# Patient Record
Sex: Female | Born: 1965
Health system: Southern US, Community
[De-identification: ages and names within clinical notes are randomized; demographics above are authoritative.]

## PROBLEM LIST (undated history)

## (undated) DIAGNOSIS — O24419 Gestational diabetes mellitus in pregnancy, unspecified control: Secondary | ICD-10-CM

## (undated) DIAGNOSIS — Z8619 Personal history of other infectious and parasitic diseases: Secondary | ICD-10-CM

## (undated) DIAGNOSIS — M8580 Other specified disorders of bone density and structure, unspecified site: Secondary | ICD-10-CM

## (undated) HISTORY — DX: Gestational diabetes mellitus in pregnancy, unspecified control: O24.419

## (undated) HISTORY — DX: Other specified disorders of bone density and structure, unspecified site: M85.80

## (undated) HISTORY — DX: Personal history of other infectious and parasitic diseases: Z86.19

---

## 1998-12-30 ENCOUNTER — Other Ambulatory Visit: Admission: RE | Admit: 1998-12-30 | Discharge: 1998-12-30 | Payer: Self-pay | Admitting: Obstetrics and Gynecology

## 1999-01-07 ENCOUNTER — Encounter: Payer: Self-pay | Admitting: Obstetrics and Gynecology

## 1999-01-07 ENCOUNTER — Ambulatory Visit (HOSPITAL_COMMUNITY): Admission: RE | Admit: 1999-01-07 | Discharge: 1999-01-07 | Payer: Self-pay | Admitting: Obstetrics and Gynecology

## 1999-09-20 ENCOUNTER — Encounter: Admission: RE | Admit: 1999-09-20 | Discharge: 1999-09-20 | Payer: Self-pay | Admitting: Obstetrics and Gynecology

## 1999-09-20 ENCOUNTER — Encounter: Payer: Self-pay | Admitting: Obstetrics and Gynecology

## 2000-11-01 ENCOUNTER — Encounter: Payer: Self-pay | Admitting: Obstetrics and Gynecology

## 2000-11-01 ENCOUNTER — Encounter: Admission: RE | Admit: 2000-11-01 | Discharge: 2000-11-01 | Payer: Self-pay | Admitting: Obstetrics and Gynecology

## 2002-11-05 ENCOUNTER — Other Ambulatory Visit: Admission: RE | Admit: 2002-11-05 | Discharge: 2002-11-05 | Payer: Self-pay | Admitting: Obstetrics and Gynecology

## 2004-02-03 ENCOUNTER — Other Ambulatory Visit: Admission: RE | Admit: 2004-02-03 | Discharge: 2004-02-03 | Payer: Self-pay | Admitting: Obstetrics and Gynecology

## 2017-10-02 ENCOUNTER — Other Ambulatory Visit: Payer: Self-pay | Admitting: Family

## 2017-10-02 DIAGNOSIS — Z1231 Encounter for screening mammogram for malignant neoplasm of breast: Secondary | ICD-10-CM

## 2017-10-09 ENCOUNTER — Ambulatory Visit (HOSPITAL_BASED_OUTPATIENT_CLINIC_OR_DEPARTMENT_OTHER)
Admission: RE | Admit: 2017-10-09 | Discharge: 2017-10-09 | Disposition: A | Discharge status: Home / Self Care | Payer: 59 | Source: Ambulatory Visit | Attending: Family | Admitting: Family

## 2017-10-09 ENCOUNTER — Encounter (HOSPITAL_BASED_OUTPATIENT_CLINIC_OR_DEPARTMENT_OTHER): Payer: Self-pay | Admitting: Radiology

## 2017-10-09 DIAGNOSIS — Z1231 Encounter for screening mammogram for malignant neoplasm of breast: Secondary | ICD-10-CM | POA: Insufficient documentation

## 2017-10-10 ENCOUNTER — Other Ambulatory Visit: Payer: Self-pay | Admitting: Family

## 2017-10-10 DIAGNOSIS — R928 Other abnormal and inconclusive findings on diagnostic imaging of breast: Secondary | ICD-10-CM

## 2017-10-11 ENCOUNTER — Telehealth: Payer: Self-pay | Admitting: *Deleted

## 2017-10-11 NOTE — Telephone Encounter (Signed)
Received Physician Orders from [facility]; forwarded to provider/SLS 08/07

## 2017-10-13 ENCOUNTER — Ambulatory Visit
Admission: RE | Admit: 2017-10-13 | Discharge: 2017-10-13 | Disposition: A | Discharge status: Home / Self Care | Payer: 59 | Source: Ambulatory Visit | Attending: Family | Admitting: Family

## 2017-10-13 DIAGNOSIS — R928 Other abnormal and inconclusive findings on diagnostic imaging of breast: Secondary | ICD-10-CM

## 2017-10-13 DIAGNOSIS — R921 Mammographic calcification found on diagnostic imaging of breast: Secondary | ICD-10-CM | POA: Diagnosis not present

## 2017-11-15 ENCOUNTER — Encounter: Payer: Self-pay | Admitting: Family

## 2017-11-15 ENCOUNTER — Ambulatory Visit: Payer: 59 | Admitting: Family

## 2017-11-15 ENCOUNTER — Encounter

## 2017-11-15 VITALS — BP 134/77 | HR 71 | Temp 98.1°F | Resp 18 | Ht 62.5 in | Wt 116.2 lb

## 2017-11-15 DIAGNOSIS — R232 Flushing: Secondary | ICD-10-CM | POA: Diagnosis not present

## 2017-11-15 DIAGNOSIS — Z23 Encounter for immunization: Secondary | ICD-10-CM

## 2017-11-15 MED ORDER — GABAPENTIN 300 MG PO CAPS: 300.0000 mg | ORAL_CAPSULE | Freq: Three times a day (TID) | ORAL | 3 refills | Status: DC

## 2017-11-15 NOTE — Progress Notes (Signed)
Subjective:    Patient ID: Chelsea Bailey, female    DOB: 04/22/1965, 52 y.o.   MRN: [id number]  HPI   Patient is a 52 year old female who presents today to establish care.  Her primary concern today is insomnia.  She reports that she has difficulty staying asleep through the night.  This is been occurring for the last 2 years. Reports that her last menstrual period was 10-11 months ago.  Reports that she does have hot flashes but not as bad as 7-8 months ago. Reports that she is only having symptoms at night.  No improvement with melatonin/essential oils.    Mom died 1 year ago.  Brother died 3 years prior to her mom.  Feels like she has a lot of support/friends. Feels sad at times but does not feel that she is depressed.  Past medical history is significant only for gestational diabetes.    Review of Systems  Constitutional: Negative for unexpected weight change.  HENT: Negative for hearing loss and rhinorrhea.   Eyes: Negative for visual disturbance.  Respiratory: Negative for cough.   Cardiovascular: Negative for leg swelling.  Gastrointestinal: Negative for blood in stool and constipation.  Genitourinary: Negative for dysuria, frequency and hematuria.  Musculoskeletal: Negative for arthralgias and myalgias.  Skin: Negative for rash.  Neurological: Negative for headaches.  Hematological: Negative for adenopathy.  Psychiatric/Behavioral:       Denies significant depression/anxiety       Past Medical History:  Diagnosis Date  . Gestational diabetes   . History of chicken pox      Social History   Socioeconomic History  . Marital status: Married    Spouse name: Not on file  . Number of children: Not on file  . Years of education: Not on file  . Highest education level: Not on file  Occupational History  . Not on file  Social Needs  . Financial resource strain: Not on file  . Food insecurity:    Worry: Not on file    Inability: Not on file  . Transportation needs:      Medical: Not on file    Non-medical: Not on file  Tobacco Use  . Smoking status: Never Smoker  . Smokeless tobacco: Never Used  Substance and Sexual Activity  . Alcohol use: Yes    Alcohol/week: 4.0 standard drinks    Types: 4 Glasses of wine per week  . Drug use: Not on file  . Sexual activity: Not on file  Lifestyle  . Physical activity:    Days per week: Not on file    Minutes per session: Not on file  . Stress: Not on file  Relationships  . Social connections:    Talks on phone: Not on file    Gets together: Not on file    Attends religious service: Not on file    Active member of club or organization: Not on file    Attends meetings of clubs or organizations: Not on file    Relationship status: Not on file  . Intimate partner violence:    Fear of current or ex partner: Not on file    Emotionally abused: Not on file    Physically abused: Not on file    Forced sexual activity: Not on file  Other Topics Concern  . 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 No pertinent surgical history.  Family History  Problem Relation Age of Onset  . Arthritis Mother   . Diabetes Mother   . Hearing loss Mother   . Heart attack Mother   . Heart disease Mother        pacemaker  . Hyperlipidemia Mother   . Stroke Mother   . Alcohol abuse Father   . Lung cancer Father   . Heart disease Father        pacemaker  . Hyperlipidemia Father   . Alcohol abuse Brother   . Arthritis Brother   . Depression Brother   . Hyperlipidemia Brother   . Stroke Maternal Grandmother   . Hearing loss Maternal Grandfather   . Heart attack Maternal Grandfather   . Heart disease Maternal Grandfather   . Hyperlipidemia Maternal Grandfather   . Stroke Maternal Grandfather   . Stroke Paternal Grandmother   .  Cancer Paternal Grandfather        ?type    No Known Allergies  Current Outpatient Medications on File Prior to Visit  Medication Sig Dispense Refill  . Multiple Vitamin (MULTIVITAMIN) tablet Take 1 tablet by mouth daily.     No current facility-administered medications on file prior to visit.     BP 134/77 (BP Location: Right Arm, Cuff Size: Normal)   Pulse 71   Temp 98.1 F (36.7 C) (Oral)   Resp 18   Ht 5' 2.5" (1.588 m)   Wt 116 lb 3.2 oz (52.7 kg)   LMP 01/15/2017   SpO2 100%   BMI 20.91 kg/m    Objective:   Physical Exam  Constitutional: She is oriented to person, place, and time. She appears well-developed and well-nourished.  Neck: Neck supple. No thyromegaly present.  Cardiovascular: Normal rate, regular rhythm and normal heart sounds.  No murmur heard. Pulmonary/Chest: Effort normal and breath sounds normal. No respiratory distress. She has no wheezes.  Musculoskeletal: She exhibits no edema.  Lymphadenopathy:    She has no cervical adenopathy.  Neurological: She is alert and oriented to person, place, and time.  Skin: Skin is warm and dry.  Psychiatric: She has a normal mood and affect. Her behavior is normal. Judgment and thought content normal.          Assessment & Plan:  Hot flashes- contributing to her insomnia.  We will give her a trial of gabapentin at bedtime.  Flu shot given today.  Plan complete physical with lab work and Pap smear at her upcoming physical.

## 2017-11-15 NOTE — Patient Instructions (Addendum)
Please begin gabapentin at bedtime. Welcome to [facility]!

## 2017-12-18 ENCOUNTER — Ambulatory Visit (INDEPENDENT_AMBULATORY_CARE_PROVIDER_SITE_OTHER): Payer: 59 | Admitting: Family

## 2017-12-18 ENCOUNTER — Other Ambulatory Visit (HOSPITAL_COMMUNITY)
Admission: RE | Admit: 2017-12-18 | Discharge: 2017-12-18 | Disposition: A | Discharge status: Home / Self Care | Payer: 59 | Source: Ambulatory Visit | Attending: Family | Admitting: Family

## 2017-12-18 ENCOUNTER — Encounter: Payer: Self-pay | Admitting: Family

## 2017-12-18 VITALS — BP 118/78 | HR 68 | Temp 97.8°F | Resp 16 | Ht 63.0 in | Wt 117.0 lb

## 2017-12-18 DIAGNOSIS — Z0000 Encounter for general adult medical examination without abnormal findings: Secondary | ICD-10-CM | POA: Diagnosis not present

## 2017-12-18 DIAGNOSIS — Z23 Encounter for immunization: Secondary | ICD-10-CM

## 2017-12-18 DIAGNOSIS — Z01419 Encounter for gynecological examination (general) (routine) without abnormal findings: Secondary | ICD-10-CM

## 2017-12-18 DIAGNOSIS — E348 Other specified endocrine disorders: Secondary | ICD-10-CM

## 2017-12-18 LAB — CBC WITH DIFFERENTIAL/PLATELET
BASOS ABS: 0 10*3/uL (ref 0.0–0.1)
Basophils Relative: 1.1 % (ref 0.0–3.0)
EOS ABS: 0.2 10*3/uL (ref 0.0–0.7)
Eosinophils Relative: 5 % (ref 0.0–5.0)
HEMATOCRIT: 40.6 % (ref 36.0–46.0)
HEMOGLOBIN: 13.8 g/dL (ref 12.0–15.0)
LYMPHS PCT: 28.9 % (ref 12.0–46.0)
Lymphs Abs: 1.2 10*3/uL (ref 0.7–4.0)
MCHC: 33.9 g/dL (ref 30.0–36.0)
MCV: 94.7 fl (ref 78.0–100.0)
Monocytes Absolute: 0.5 10*3/uL (ref 0.1–1.0)
Monocytes Relative: 11.9 % (ref 3.0–12.0)
NEUTROS ABS: 2.2 10*3/uL (ref 1.4–7.7)
Neutrophils Relative %: 53.1 % (ref 43.0–77.0)
PLATELETS: 299 10*3/uL (ref 150.0–400.0)
RBC: 4.29 Mil/uL (ref 3.87–5.11)
RDW: 12.4 % (ref 11.5–15.5)
WBC: 4.1 10*3/uL (ref 4.0–10.5)

## 2017-12-18 LAB — URINALYSIS, ROUTINE W REFLEX MICROSCOPIC
Bilirubin Urine: NEGATIVE
Ketones, ur: NEGATIVE
NITRITE: NEGATIVE
PH: 6 (ref 5.0–8.0)
SPECIFIC GRAVITY, URINE: 1.015 (ref 1.000–1.030)
TOTAL PROTEIN, URINE-UPE24: NEGATIVE
Urine Glucose: NEGATIVE
Urobilinogen, UA: 0.2 (ref 0.0–1.0)

## 2017-12-18 LAB — LIPID PANEL
CHOLESTEROL: 221 mg/dL — AB (ref 0–200)
HDL: 89.6 mg/dL (ref >39.00)
LDL CALC: 101 mg/dL — AB (ref 0–99)
NonHDL: 131.76
TRIGLYCERIDES: 154 mg/dL — AB (ref 0.0–149.0)
Total CHOL/HDL Ratio: 2
VLDL: 30.8 mg/dL (ref 0.0–40.0)

## 2017-12-18 LAB — BASIC METABOLIC PANEL
BUN: 17 mg/dL (ref 6–23)
CALCIUM: 9.5 mg/dL (ref 8.4–10.5)
CO2: 31 mEq/L (ref 19–32)
CREATININE: 0.76 mg/dL (ref 0.40–1.20)
Chloride: 101 mEq/L (ref 96–112)
GFR: 84.23 mL/min (ref >60.00)
Glucose, Bld: 89 mg/dL (ref 70–99)
Potassium: 3.9 mEq/L (ref 3.5–5.1)
Sodium: 137 mEq/L (ref 135–145)

## 2017-12-18 LAB — HEPATIC FUNCTION PANEL
ALK PHOS: 58 U/L (ref 39–117)
ALT: 15 U/L (ref 0–35)
AST: 17 U/L (ref 0–37)
Albumin: 4.3 g/dL (ref 3.5–5.2)
BILIRUBIN DIRECT: 0.1 mg/dL (ref 0.0–0.3)
TOTAL PROTEIN: 7.1 g/dL (ref 6.0–8.3)
Total Bilirubin: 0.5 mg/dL (ref 0.2–1.2)

## 2017-12-18 LAB — TSH: TSH: 2.1 u[IU]/mL (ref 0.35–4.50)

## 2017-12-18 MED ORDER — CALCIUM CARBONATE-VITAMIN D 600-400 MG-UNIT PO TABS: 1.0000 | ORAL_TABLET | Freq: Two times a day (BID) | ORAL | Status: AC

## 2017-12-18 MED ORDER — GABAPENTIN 300 MG PO CAPS: 600.0000 mg | ORAL_CAPSULE | Freq: Every day | ORAL | 1 refills | Status: DC

## 2017-12-18 NOTE — Progress Notes (Signed)
Subjective:    Patient ID: Chelsea Bailey, female    DOB: Jul 23, 1965, 52 y.o.   MRN: [id number]  HPI  Patient presents today for complete physical.  Immunizations: flu last visit.  Tetanus ? Diet: healthy Exercise: some walking Colonoscopy:  Due  Dexa: LMP was 1 year ago Pap Smear: ? Last pap Mammogram: just completed a diagnostic.  Wt Readings from Last 3 Encounters:  12/18/17 117 lb (53.1 kg)  11/15/17 116 lb 3.2 oz (52.7 kg)  dental: up to date Vision: due will schedule   Hot flashes- last visit we gave her a trial of gabapentin at bedtime.  Review of Systems  Constitutional: Negative for unexpected weight change.  HENT: Negative for hearing loss and rhinorrhea.   Eyes: Negative for visual disturbance.  Respiratory: Negative for cough and shortness of breath.   Cardiovascular: Negative for chest pain and leg swelling.  Gastrointestinal: Negative for constipation, diarrhea and nausea.  Genitourinary: Negative for dysuria, frequency and hematuria.  Musculoskeletal: Negative for arthralgias and myalgias.  Skin: Negative for rash.  Neurological: Negative for headaches.  Hematological: Negative for adenopathy.  Psychiatric/Behavioral:       Denies depression/anxiety   Past Medical History:  Diagnosis Date  . Gestational diabetes   . History of chicken pox      Social History   Socioeconomic History  . Marital status: Married    Spouse name: Not on file  . Number of children: Not on file  . Years of education: Not on file  . Highest education level: Not on file  Occupational History  . Not on file  Social Needs  . Financial resource strain: Not on file  . Food insecurity:    Worry: Not on file    Inability: Not on file  . Transportation needs:    Medical: Not on file    Non-medical: Not on file  Tobacco Use  . Smoking status: Never Smoker  . Smokeless tobacco: Never Used  Substance and Sexual Activity  . Alcohol use: Yes    Alcohol/week: 4.0 standard  drinks    Types: 4 Glasses of wine per week  . Drug use: Not on file  . Sexual activity: Not on file  Lifestyle  . Physical activity:    Days per week: Not on file    Minutes per session: Not on file  . Stress: Not on file  Relationships  . Social connections:    Talks on phone: Not on file    Gets together: Not on file    Attends religious service: Not on file    Active member of club or organization: Not on file    Attends meetings of clubs or organizations: Not on file    Relationship status: Not on file  . Intimate partner violence:    Fear of current or ex partner: Not on file    Emotionally abused: Not on file    Physically abused: Not on file    Forced sexual activity: Not on file  Other Topics Concern  . 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  Family History  Problem Relation Age of Onset  . Arthritis Mother   . Diabetes Mother   . Hearing loss Mother   .  Heart attack Mother   . Heart disease Mother        pacemaker  . Hyperlipidemia Mother   . Stroke Mother   . Alcohol abuse Father   . Lung cancer Father   . Heart disease Father        pacemaker  . Hyperlipidemia Father   . Alcohol abuse Brother   . Arthritis Brother   . Depression Brother   . Hyperlipidemia Brother   . Stroke Maternal Grandmother   . Hearing loss Maternal Grandfather   . Heart attack Maternal Grandfather   . Heart disease Maternal Grandfather   . Hyperlipidemia Maternal Grandfather   . Stroke Maternal Grandfather   . Stroke Paternal Grandmother   . Cancer Paternal Grandfather        ?type    No Known Allergies  Current Outpatient Medications on File Prior to Visit  Medication Sig Dispense Refill  . Multiple Vitamin (MULTIVITAMIN) tablet Take 1 tablet by mouth daily.      No current facility-administered medications on file prior to visit.     BP 118/78 (BP Location: Right Arm, Patient Position: Sitting, Cuff Size: Small)   Pulse 68   Temp 97.8 F (36.6 C) (Oral)   Resp 16   Ht 5\' 3"  (1.6 m)   Wt 117 lb (53.1 kg)   SpO2 99%   BMI 20.73 kg/m        Objective:   Physical Exam  Physical Exam  Constitutional: She is oriented to person, place, and time. She appears well-developed and well-nourished. No distress.  HENT:  Head: Normocephalic and atraumatic.  Right Ear: Tympanic membrane and ear canal normal.  Left Ear: Tympanic membrane and ear canal normal.  Mouth/Throat: Oropharynx is clear and moist.  Eyes: Pupils are equal, round, and reactive to light. No scleral icterus.  Neck: Normal range of motion. No thyromegaly present.  Cardiovascular: Normal rate and regular rhythm.   No murmur heard. Pulmonary/Chest: Effort normal and breath sounds normal. No respiratory distress. He has no wheezes. She has no rales. She exhibits no tenderness.  Abdominal: Soft. Bowel sounds are normal. She exhibits no distension and no mass. There is no tenderness. There is no rebound and no guarding.  Musculoskeletal: She exhibits no edema.  Lymphadenopathy:    She has no cervical adenopathy.  Neurological: She is alert and oriented to person, place, and time. She has normal patellar reflexes. She exhibits normal muscle tone. Coordination normal.  Skin: Skin is warm and dry.  Psychiatric: She has a normal mood and affect. Her behavior is normal. Judgment and thought content normal.  Breasts: Examined lying Right: Without masses, retractions, discharge or axillary adenopathy.  Left: Without masses, retractions, discharge or axillary adenopathy.  Inguinal/mons: Normal without inguinal adenopathy  External genitalia: Normal  BUS/Urethra/Skene's glands: Normal  Bladder: Normal  Vagina: Normal  Cervix: Normal  Uterus: normal in size, shape and contour. Midline  and mobile  Adnexa/parametria:  Rt: Without masses or tenderness.  Lt: Without masses or tenderness.  Anus and perineum: Normal            Assessment & Plan:   Preventative care-discussed healthy diet/exercise.  Obtain routine lab work. Refer for colonoscopy. Shingrix #1 today.  Refer for dexa. Pap performed. Assessment & Plan:         Assessment & Plan:  EKG tracing is personally reviewed.  EKG notes NSR.  No acute changes.

## 2017-12-18 NOTE — Patient Instructions (Signed)
Please complete lab work prior to leaving. Try to walk 30 minutes 5 days a week.  Schedule bone density on the first floor. Schedule routine eye exam at your convenience.

## 2017-12-19 LAB — CYTOLOGY - PAP
Diagnosis: NEGATIVE
HPV: NOT DETECTED

## 2017-12-20 ENCOUNTER — Telehealth: Payer: Self-pay | Admitting: Family

## 2017-12-20 DIAGNOSIS — R3129 Other microscopic hematuria: Secondary | ICD-10-CM

## 2017-12-20 MED ORDER — NITROFURANTOIN MONOHYD MACRO 100 MG PO CAPS: 100.0000 mg | ORAL_CAPSULE | Freq: Two times a day (BID) | ORAL | 0 refills | Status: DC

## 2017-12-20 NOTE — Telephone Encounter (Signed)
Please contact pt and let her know that it looks like she may have a UTI. I would like to treat her with macrodantin and have her repeat UA with micro in 2 weeks. There is some microscopic blood in the urine and I want to make sure that it clears up. (orders placed).   Cholesterol/trigs mildly elevated.  Please work on low fat diet and avoiding concentrated sweets.    Pap smear is negative.

## 2017-12-20 NOTE — Telephone Encounter (Signed)
Advised patient of results and to pick up rx. She will work on her diet.

## 2018-01-01 ENCOUNTER — Other Ambulatory Visit (INDEPENDENT_AMBULATORY_CARE_PROVIDER_SITE_OTHER): Payer: 59

## 2018-01-01 ENCOUNTER — Ambulatory Visit (HOSPITAL_BASED_OUTPATIENT_CLINIC_OR_DEPARTMENT_OTHER)
Admission: RE | Admit: 2018-01-01 | Discharge: 2018-01-01 | Disposition: A | Discharge status: Home / Self Care | Payer: 59 | Source: Ambulatory Visit | Attending: Family | Admitting: Family

## 2018-01-01 DIAGNOSIS — E348 Other specified endocrine disorders: Secondary | ICD-10-CM | POA: Insufficient documentation

## 2018-01-01 DIAGNOSIS — M85852 Other specified disorders of bone density and structure, left thigh: Secondary | ICD-10-CM | POA: Insufficient documentation

## 2018-01-01 DIAGNOSIS — Z780 Asymptomatic menopausal state: Secondary | ICD-10-CM | POA: Diagnosis not present

## 2018-01-01 DIAGNOSIS — R3129 Other microscopic hematuria: Secondary | ICD-10-CM

## 2018-01-01 LAB — URINALYSIS, ROUTINE W REFLEX MICROSCOPIC
Bilirubin Urine: NEGATIVE
Ketones, ur: NEGATIVE
Leukocytes, UA: NEGATIVE
Nitrite: NEGATIVE
PH: 6.5 (ref 5.0–8.0)
SPECIFIC GRAVITY, URINE: 1.02 (ref 1.000–1.030)
TOTAL PROTEIN, URINE-UPE24: 30 — AB
Urine Glucose: NEGATIVE
Urobilinogen, UA: 0.2 (ref 0.0–1.0)

## 2018-01-02 ENCOUNTER — Encounter: Payer: Self-pay | Admitting: Family

## 2018-01-02 ENCOUNTER — Telehealth: Payer: Self-pay | Admitting: Family

## 2018-01-02 DIAGNOSIS — R3129 Other microscopic hematuria: Secondary | ICD-10-CM

## 2018-01-02 DIAGNOSIS — M8580 Other specified disorders of bone density and structure, unspecified site: Secondary | ICD-10-CM

## 2018-01-02 HISTORY — DX: Other specified disorders of bone density and structure, unspecified site: M85.80

## 2018-01-02 NOTE — Telephone Encounter (Signed)
Attempted to reach pt and left detailed message on voicemail and to call or send mychart message if any questions. Referral signed.

## 2018-01-02 NOTE — Telephone Encounter (Signed)
Patient is still having some microscopic blood in her urine. I would recommend that she see urology for further evaluation.  Referral is pended.

## 2018-01-22 ENCOUNTER — Encounter: Payer: Self-pay | Admitting: Family

## 2018-01-23 MED ORDER — TRAZODONE HCL 50 MG PO TABS: 25.0000 mg | ORAL_TABLET | Freq: Every evening | ORAL | 1 refills | Status: DC | PRN

## 2018-03-06 ENCOUNTER — Telehealth: Payer: Self-pay | Admitting: Family

## 2018-03-06 DIAGNOSIS — R3129 Other microscopic hematuria: Secondary | ICD-10-CM

## 2018-03-06 NOTE — Telephone Encounter (Signed)
Copied from CRM [id number]. Topic: Quick Communication - See Telephone Encounter >> Mar 06, 2018  1:57 PM Herby AbrahamJohnson, Shiquita C wrote: CRM for notification. See Telephone encounter for: 03/06/18.  Pt would like further assistance from PCP. Pt says that she received a Urology referral due to having a UTI. Pt says because of the change in her insurance she would like to know if PCP could try her with a different antibiotic first before her seeing a urologist?   Please advise.    CB: [phone]: [phone]  for call back OR pt is okay with a response back via My-Chart.

## 2018-03-09 ENCOUNTER — Other Ambulatory Visit: Payer: Self-pay

## 2018-03-09 DIAGNOSIS — R3129 Other microscopic hematuria: Secondary | ICD-10-CM

## 2018-03-09 NOTE — Telephone Encounter (Signed)
Message containing Melissa's comments sent on Mychart asper patient's request. Advised to call for lab appointment if she will like to have ua recheck. Orders for ua and micro entered as future.

## 2018-03-09 NOTE — Telephone Encounter (Signed)
Referral to urology was due to finding of microscopic blood rather than UTI. Let's have her repeat UA to see if blood still persists. If no blood then we can hold off on referral. If blood still present than I would recommend that she follow through with referral for further evaluation.

## 2018-03-13 ENCOUNTER — Ambulatory Visit: Payer: 59

## 2018-03-15 ENCOUNTER — Encounter: Payer: Self-pay | Admitting: Family

## 2018-03-16 ENCOUNTER — Ambulatory Visit (INDEPENDENT_AMBULATORY_CARE_PROVIDER_SITE_OTHER): Payer: BLUE CROSS/BLUE SHIELD

## 2018-03-16 DIAGNOSIS — Z23 Encounter for immunization: Secondary | ICD-10-CM | POA: Diagnosis not present

## 2018-08-27 ENCOUNTER — Other Ambulatory Visit: Payer: Self-pay | Admitting: Family

## 2018-10-22 ENCOUNTER — Other Ambulatory Visit: Payer: Self-pay

## 2018-10-22 ENCOUNTER — Other Ambulatory Visit (INDEPENDENT_AMBULATORY_CARE_PROVIDER_SITE_OTHER): Payer: Self-pay

## 2018-10-22 DIAGNOSIS — R3129 Other microscopic hematuria: Secondary | ICD-10-CM

## 2018-10-22 LAB — URINALYSIS, ROUTINE W REFLEX MICROSCOPIC
Bilirubin Urine: NEGATIVE
Ketones, ur: NEGATIVE
Leukocytes,Ua: NEGATIVE
Nitrite: NEGATIVE
Specific Gravity, Urine: 1.025 (ref 1.000–1.030)
Total Protein, Urine: NEGATIVE
Urine Glucose: NEGATIVE
Urobilinogen, UA: 0.2 (ref 0.0–1.0)
pH: 5.5 (ref 5.0–8.0)

## 2018-10-24 ENCOUNTER — Other Ambulatory Visit: Payer: Self-pay

## 2018-10-24 ENCOUNTER — Telehealth: Payer: Self-pay | Admitting: Family

## 2018-10-24 DIAGNOSIS — R3129 Other microscopic hematuria: Secondary | ICD-10-CM

## 2018-10-24 NOTE — Telephone Encounter (Signed)
Lvm for patent to call back about this information.

## 2018-10-24 NOTE — Telephone Encounter (Signed)
Please advise pt that her urine is still showing microscopic blood. Did she end up meeting with Urology last fall for this? If not, then I would recommend that we re-order urology referral please to [facility], dx microscopic hematuria.

## 2018-10-24 NOTE — Telephone Encounter (Signed)
Talked to patient, I gave her results and she reports she never scheduled to see urologist. She had a change on her insurance and will like for a new referral to be initiated. Referral entered for microhematuria

## 2018-11-30 ENCOUNTER — Other Ambulatory Visit: Payer: Self-pay | Admitting: Family

## 2018-11-30 DIAGNOSIS — Z1231 Encounter for screening mammogram for malignant neoplasm of breast: Secondary | ICD-10-CM

## 2018-12-21 ENCOUNTER — Encounter: Payer: 59 | Admitting: Family

## 2019-01-08 ENCOUNTER — Ambulatory Visit
Admission: RE | Admit: 2019-01-08 | Discharge: 2019-01-08 | Disposition: A | Discharge status: Home / Self Care | Payer: BC Managed Care – PPO | Source: Ambulatory Visit | Attending: Family | Admitting: Family

## 2019-01-08 ENCOUNTER — Other Ambulatory Visit: Payer: Self-pay

## 2019-01-08 DIAGNOSIS — Z1231 Encounter for screening mammogram for malignant neoplasm of breast: Secondary | ICD-10-CM

## 2019-01-09 ENCOUNTER — Ambulatory Visit (INDEPENDENT_AMBULATORY_CARE_PROVIDER_SITE_OTHER): Payer: BC Managed Care – PPO | Admitting: Family

## 2019-01-09 ENCOUNTER — Other Ambulatory Visit: Payer: Self-pay

## 2019-01-09 ENCOUNTER — Encounter: Payer: Self-pay | Admitting: Family

## 2019-01-09 VITALS — Ht 62.0 in | Wt 117.0 lb

## 2019-01-09 DIAGNOSIS — Z0000 Encounter for general adult medical examination without abnormal findings: Secondary | ICD-10-CM | POA: Diagnosis not present

## 2019-01-09 NOTE — Progress Notes (Signed)
Virtual Visit via Video Note  I connected with Chelsea Bailey on 01/09/19 at  9:20 AM EST by a video enabled telemedicine application and verified that I am speaking with the correct person using two identifiers.  Location: Patient: home Provider: office   I discussed the limitations of evaluation and management by telemedicine and the availability of in person appointments. The patient expressed understanding and agreed to proceed.  History of Present Illness:  Patient presents today for complete physical.  Immunizations: flu shot will be completed Friday. Shingrix complete, tdap 2019 Diet:reports healthy diet Exercise: exercising more (walking) Wt Readings from Last 3 Encounters:  01/09/19 117 lb (53.1 kg)  12/18/17 117 lb (53.1 kg)  11/15/17 116 lb 3.2 oz (52.7 kg)  Colonoscopy: due Dexa: 2019 Pap Smear:12/18/17 Mammogram: yesterday Vision: up to date Dental: up to date  She continues gabapentin at bedtime for hot flashes and reports that this is stable.  ROS  Gen: weight stable. Resp: denies cough/ congestion GU: denies dysuria or frequency GI: Denies N/V/D or blood in the stool MS: denies muscle or joint pain Psych: denies depression, anxiety-  Notes occasional mood swings a few times week. Notes some irritability  Past Medical History:  Diagnosis Date  . Gestational diabetes   . History of chicken pox   . Osteopenia 01/02/2018     Social History   Socioeconomic History  . Marital status: Married    Spouse name: Not on file  . Number of children: Not on file  . Years of education: Not on file  . Highest education level: Not on file  Occupational History  . Not on file  Social Needs  . Financial resource strain: Not on file  . Food insecurity    Worry: Not on file    Inability: Not on file  . Transportation needs    Medical: Not on file    Non-medical: Not on file  Tobacco Use  . Smoking status: Never Smoker  . Smokeless tobacco: Never Used   Substance and Sexual Activity  . Alcohol use: Yes    Alcohol/week: 4.0 standard drinks    Types: 4 Glasses of wine per week  . Drug use: Not on file  . Sexual activity: Not on file  Lifestyle  . Physical activity    Days per week: Not on file    Minutes per session: Not on file  . Stress: Not on file  Relationships  . Social Herbalist on phone: Not on file    Gets together: Not on file    Attends religious service: Not on file    Active member of club or organization: Not on file    Attends meetings of clubs or organizations: Not on file    Relationship status: Not on file  . Intimate partner violence    Fear of current or ex partner: Not on file    Emotionally abused: Not on file    Physically abused: Not on file    Forced sexual activity: Not on file  Other Topics Concern  . 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 No pertinent surgical history.  Family History  Problem Relation Age of Onset  . Arthritis Mother   . Diabetes Mother   .  Hearing loss Mother   . Heart attack Mother   . Heart disease Mother        pacemaker  . Hyperlipidemia Mother   . Stroke Mother   . Alcohol abuse Father   . Lung cancer Father   . Heart disease Father        pacemaker  . Hyperlipidemia Father   . Alcohol abuse Brother   . Arthritis Brother   . Depression Brother   . Hyperlipidemia Brother   . Stroke Maternal Grandmother   . Hearing loss Maternal Grandfather   . Heart attack Maternal Grandfather   . Heart disease Maternal Grandfather   . Hyperlipidemia Maternal Grandfather   . Stroke Maternal Grandfather   . Stroke Paternal Grandmother   . Cancer Paternal Grandfather        ?type    No Known Allergies  Current Outpatient Medications on File Prior to Visit  Medication Sig  Dispense Refill  . Calcium Carbonate-Vitamin D 600-400 MG-UNIT tablet Take 1 tablet by mouth 2 (two) times daily.    Marland Kitchen gabapentin (NEURONTIN) 300 MG capsule TAKE 2 CAPSULES(600 MG) BY MOUTH AT BEDTIME 180 capsule 1  . Multiple Vitamin (MULTIVITAMIN) tablet Take 1 tablet by mouth daily.    . traZODone (DESYREL) 50 MG tablet Take 0.5-1 tablets (25-50 mg total) by mouth at bedtime as needed for sleep. 30 tablet 1   No current facility-administered medications on file prior to visit.     Ht 5\' 2"  (1.575 m)   Wt 117 lb (53.1 kg)   LMP 01/15/2017   BMI 21.40 kg/m     Observations/Objective:   Gen: Awake, alert, no acute distress Resp: Breathing is even and non-labored Psych: calm/pleasant demeanor Neuro: Alert and Oriented x 3, + facial symmetry, speech is clear.   Assessment and Plan:  Preventative care-encourage patient to continue healthy diet, and exercise.  Immunizations reviewed and up-to-date.  Mammogram and Pap smear are also up-to-date.  Follow Up Instructions:    I discussed the assessment and treatment plan with the patient. The patient was provided an opportunity to ask questions and all were answered. The patient agreed with the plan and demonstrated an understanding of the instructions.   The patient was advised to call back or seek an in-person evaluation if the symptoms worsen or if the condition fails to improve as anticipated.  13/01/2017, NP

## 2019-01-10 ENCOUNTER — Telehealth: Payer: Self-pay | Admitting: Family

## 2019-01-10 ENCOUNTER — Other Ambulatory Visit: Payer: Self-pay | Admitting: Family

## 2019-01-10 DIAGNOSIS — R928 Other abnormal and inconclusive findings on diagnostic imaging of breast: Secondary | ICD-10-CM

## 2019-01-10 NOTE — Telephone Encounter (Signed)
Patient advised she will receive a call from radiology about getting additional images.

## 2019-01-10 NOTE — Telephone Encounter (Signed)
Please let pt know that the radiologist would like her to complete some additional breast images for further evaluation. Let me know if she has not been contacted by them about a follow up appointment in 1 week.   

## 2019-01-11 ENCOUNTER — Other Ambulatory Visit: Payer: Self-pay

## 2019-01-11 ENCOUNTER — Ambulatory Visit (INDEPENDENT_AMBULATORY_CARE_PROVIDER_SITE_OTHER): Payer: BC Managed Care – PPO

## 2019-01-11 DIAGNOSIS — Z23 Encounter for immunization: Secondary | ICD-10-CM

## 2019-01-14 ENCOUNTER — Other Ambulatory Visit (INDEPENDENT_AMBULATORY_CARE_PROVIDER_SITE_OTHER): Payer: BC Managed Care – PPO

## 2019-01-14 ENCOUNTER — Other Ambulatory Visit: Payer: Self-pay

## 2019-01-14 DIAGNOSIS — Z0000 Encounter for general adult medical examination without abnormal findings: Secondary | ICD-10-CM | POA: Diagnosis not present

## 2019-01-14 LAB — HEPATIC FUNCTION PANEL
ALT: 14 U/L (ref 0–35)
AST: 20 U/L (ref 0–37)
Albumin: 4.6 g/dL (ref 3.5–5.2)
Alkaline Phosphatase: 72 U/L (ref 39–117)
Bilirubin, Direct: 0.1 mg/dL (ref 0.0–0.3)
Total Bilirubin: 0.4 mg/dL (ref 0.2–1.2)
Total Protein: 7.3 g/dL (ref 6.0–8.3)

## 2019-01-14 LAB — BASIC METABOLIC PANEL
BUN: 13 mg/dL (ref 6–23)
CO2: 28 mEq/L (ref 19–32)
Calcium: 9.7 mg/dL (ref 8.4–10.5)
Chloride: 101 mEq/L (ref 96–112)
Creatinine, Ser: 0.8 mg/dL (ref 0.40–1.20)
GFR: 74.39 mL/min (ref >60.00)
Glucose, Bld: 77 mg/dL (ref 70–99)
Potassium: 4.3 mEq/L (ref 3.5–5.1)
Sodium: 137 mEq/L (ref 135–145)

## 2019-01-14 LAB — CBC WITH DIFFERENTIAL/PLATELET
Basophils Absolute: 0.1 10*3/uL (ref 0.0–0.1)
Basophils Relative: 1 % (ref 0.0–3.0)
Eosinophils Absolute: 0.3 10*3/uL (ref 0.0–0.7)
Eosinophils Relative: 6 % — ABNORMAL HIGH (ref 0.0–5.0)
HCT: 41.3 % (ref 36.0–46.0)
Hemoglobin: 14 g/dL (ref 12.0–15.0)
Lymphocytes Relative: 29.6 % (ref 12.0–46.0)
Lymphs Abs: 1.7 10*3/uL (ref 0.7–4.0)
MCHC: 33.9 g/dL (ref 30.0–36.0)
MCV: 94.1 fl (ref 78.0–100.0)
Monocytes Absolute: 0.7 10*3/uL (ref 0.1–1.0)
Monocytes Relative: 11.7 % (ref 3.0–12.0)
Neutro Abs: 3 10*3/uL (ref 1.4–7.7)
Neutrophils Relative %: 51.7 % (ref 43.0–77.0)
Platelets: 325 10*3/uL (ref 150.0–400.0)
RBC: 4.38 Mil/uL (ref 3.87–5.11)
RDW: 12.6 % (ref 11.5–15.5)
WBC: 5.8 10*3/uL (ref 4.0–10.5)

## 2019-01-14 LAB — LDL CHOLESTEROL, DIRECT: Direct LDL: 98 mg/dL

## 2019-01-14 LAB — LIPID PANEL
Cholesterol: 246 mg/dL — ABNORMAL HIGH (ref 0–200)
HDL: 85.9 mg/dL (ref >39.00)
NonHDL: 160.07
Total CHOL/HDL Ratio: 3
Triglycerides: 263 mg/dL — ABNORMAL HIGH (ref 0.0–149.0)
VLDL: 52.6 mg/dL — ABNORMAL HIGH (ref 0.0–40.0)

## 2019-01-14 LAB — TSH: TSH: 2.04 u[IU]/mL (ref 0.35–4.50)

## 2019-01-15 ENCOUNTER — Encounter: Payer: Self-pay | Admitting: Family

## 2019-01-24 ENCOUNTER — Other Ambulatory Visit: Payer: Self-pay

## 2019-01-28 ENCOUNTER — Ambulatory Visit
Admission: RE | Admit: 2019-01-28 | Discharge: 2019-01-28 | Disposition: A | Discharge status: Home / Self Care | Payer: BC Managed Care – PPO | Source: Ambulatory Visit | Attending: Family | Admitting: Family

## 2019-01-28 ENCOUNTER — Other Ambulatory Visit: Payer: Self-pay

## 2019-01-28 ENCOUNTER — Other Ambulatory Visit: Payer: Self-pay | Admitting: Family

## 2019-01-28 DIAGNOSIS — R928 Other abnormal and inconclusive findings on diagnostic imaging of breast: Secondary | ICD-10-CM

## 2019-01-28 DIAGNOSIS — N6320 Unspecified lump in the left breast, unspecified quadrant: Secondary | ICD-10-CM | POA: Diagnosis not present

## 2019-01-28 DIAGNOSIS — N6489 Other specified disorders of breast: Secondary | ICD-10-CM | POA: Diagnosis not present

## 2019-01-28 DIAGNOSIS — R922 Inconclusive mammogram: Secondary | ICD-10-CM | POA: Diagnosis not present

## 2019-01-28 DIAGNOSIS — N6310 Unspecified lump in the right breast, unspecified quadrant: Secondary | ICD-10-CM | POA: Diagnosis not present

## 2019-02-05 ENCOUNTER — Other Ambulatory Visit: Payer: Self-pay | Admitting: Family

## 2019-02-05 ENCOUNTER — Ambulatory Visit
Admission: RE | Admit: 2019-02-05 | Discharge: 2019-02-05 | Disposition: A | Discharge status: Home / Self Care | Payer: BC Managed Care – PPO | Source: Ambulatory Visit | Attending: Family | Admitting: Family

## 2019-02-05 ENCOUNTER — Other Ambulatory Visit: Payer: Self-pay

## 2019-02-05 DIAGNOSIS — R928 Other abnormal and inconclusive findings on diagnostic imaging of breast: Secondary | ICD-10-CM

## 2019-02-05 DIAGNOSIS — N6001 Solitary cyst of right breast: Secondary | ICD-10-CM | POA: Diagnosis not present

## 2019-03-12 ENCOUNTER — Other Ambulatory Visit: Payer: Self-pay | Admitting: Family

## 2019-03-18 ENCOUNTER — Encounter: Payer: Self-pay | Admitting: Family

## 2019-10-08 ENCOUNTER — Other Ambulatory Visit: Payer: Self-pay | Admitting: Family

## 2019-10-08 ENCOUNTER — Other Ambulatory Visit: Payer: Self-pay

## 2019-10-08 MED ORDER — GABAPENTIN 300 MG PO CAPS: ORAL_CAPSULE | ORAL | 0 refills | Status: DC

## 2020-01-13 ENCOUNTER — Other Ambulatory Visit: Payer: Self-pay | Admitting: Family

## 2020-04-12 ENCOUNTER — Other Ambulatory Visit: Payer: Self-pay | Admitting: Family

## 2020-11-24 IMAGING — MG DIGITAL SCREENING BILAT W/ TOMO W/ CAD
8 series · 9 of 24 positions shown · non-contrast
Comparison: Previous exam(s).

CLINICAL DATA: Screening.

EXAM:
DIGITAL SCREENING BILATERAL MAMMOGRAM WITH TOMO AND CAD

[R CC synth-2D]
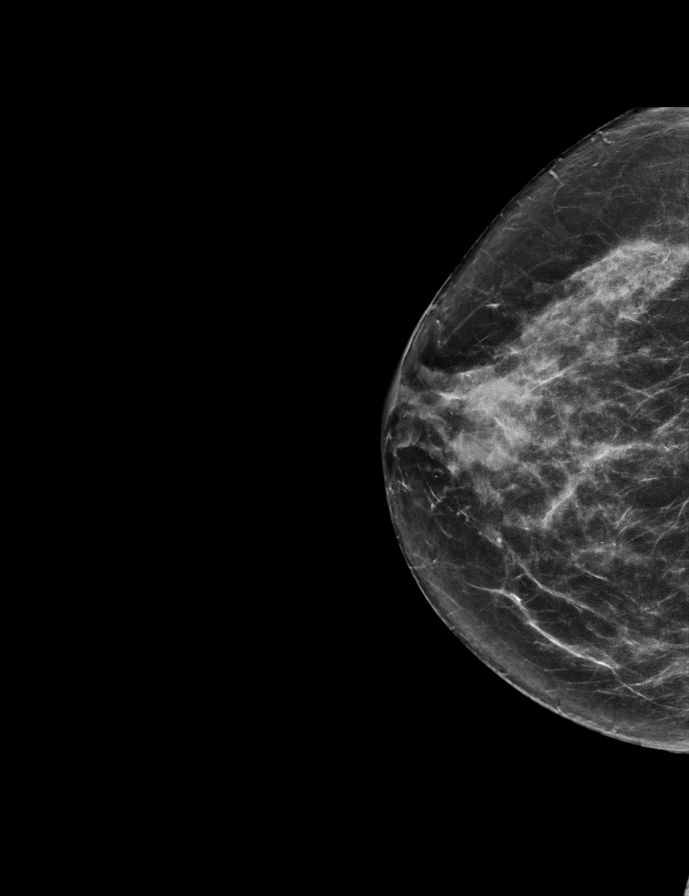

[R MLO synth-2D]
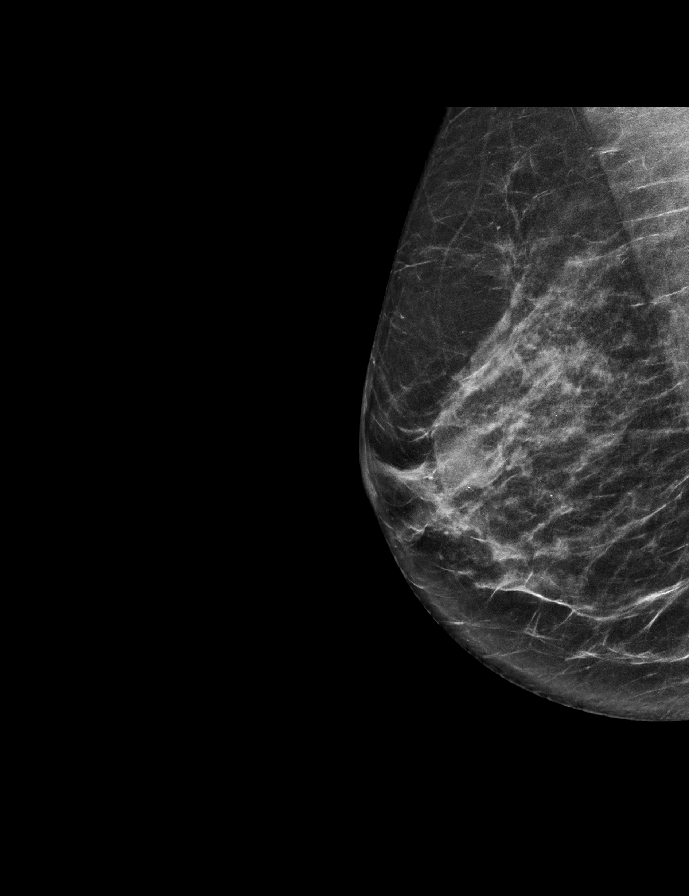

[L MLO synth-2D]
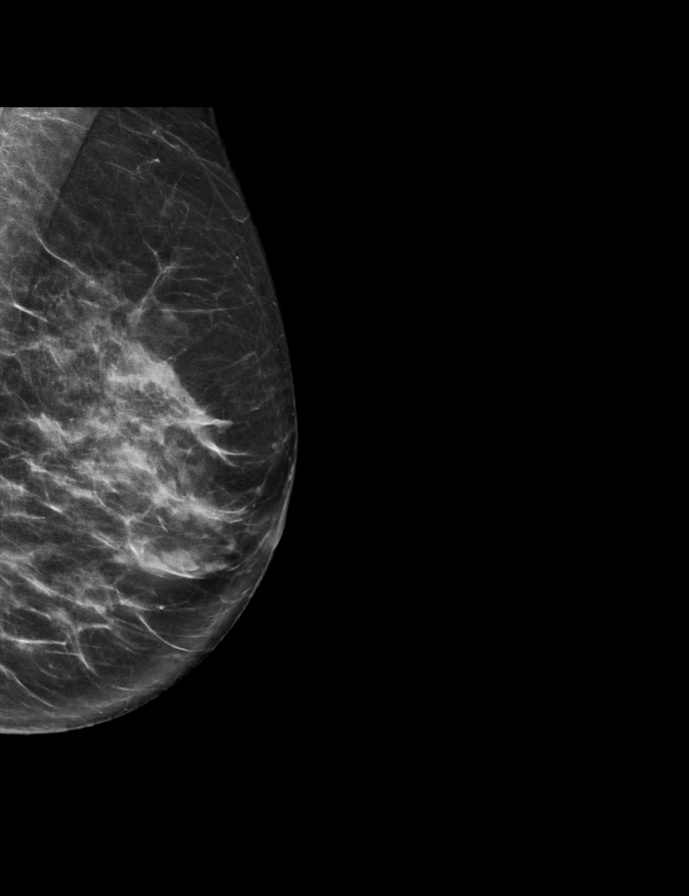

[L CC synth-2D]
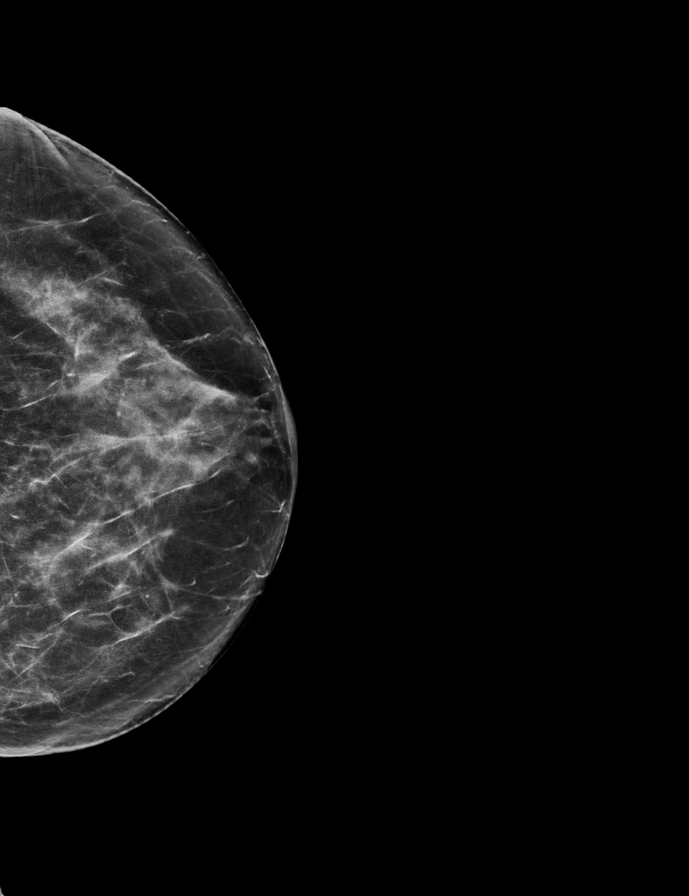

[L CC tomo · 2 of 66 frames shown]
[frame 22/66]
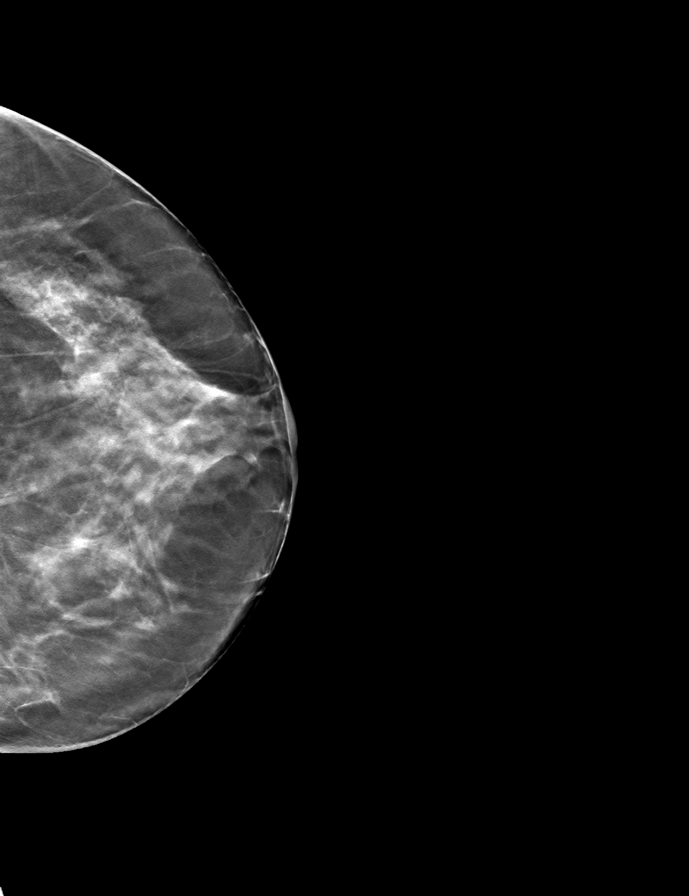
[frame 33/66]
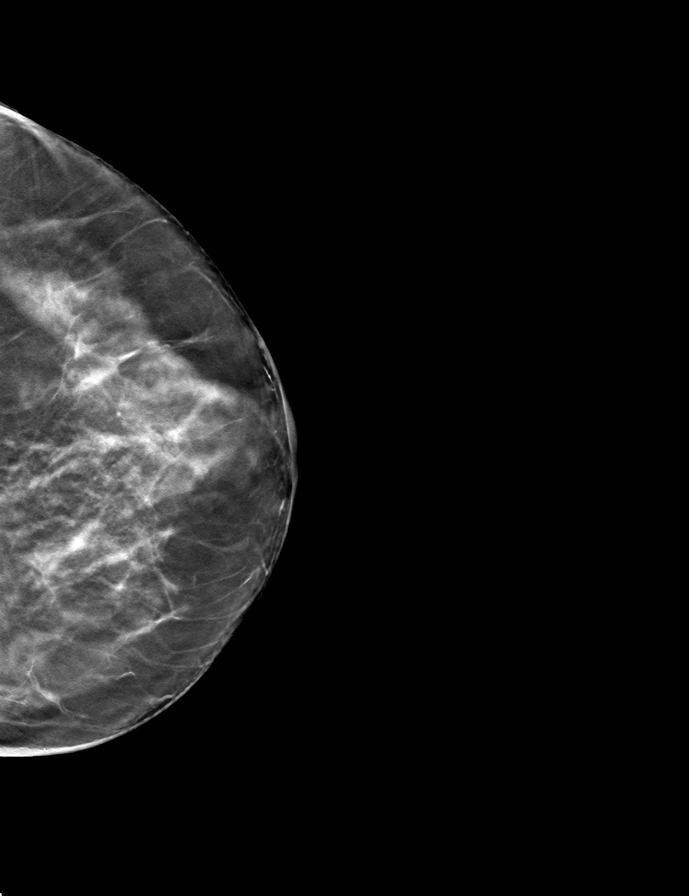

[R CC tomo · tomo slice 33/65.0]
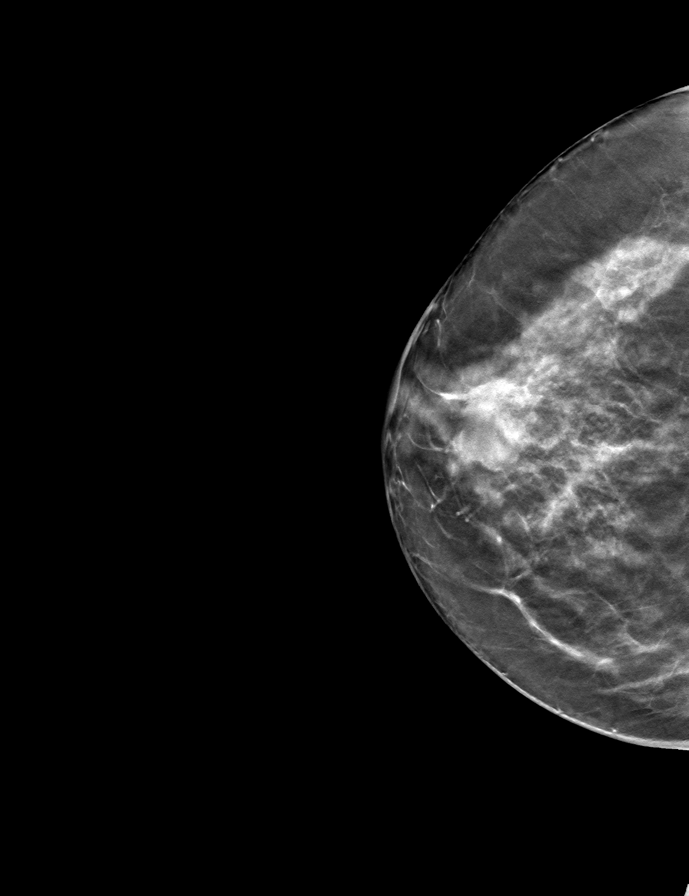

[R MLO tomo · tomo slice 31/60.0]
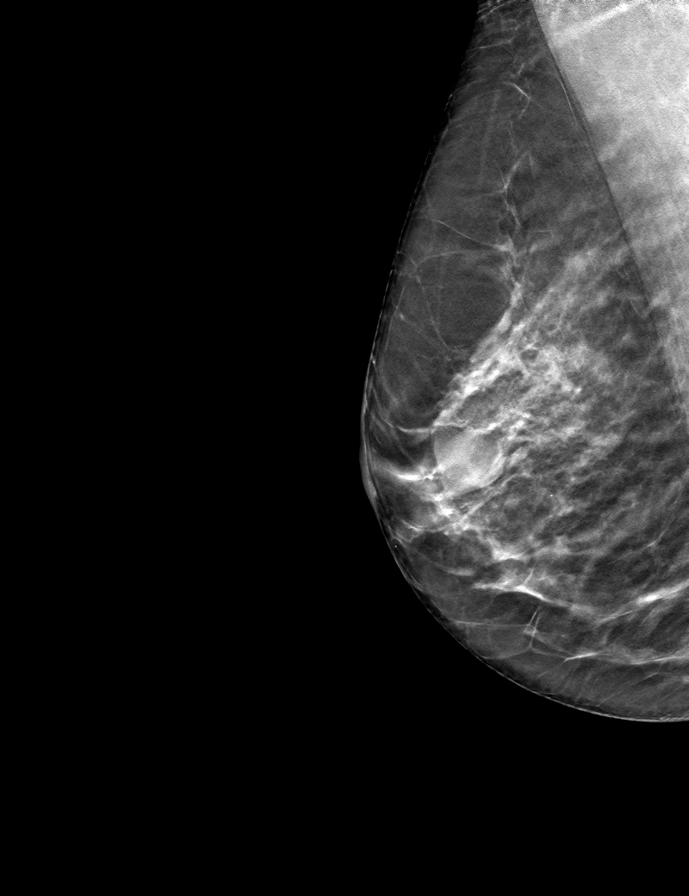

[L MLO tomo · tomo slice 32/63.0]
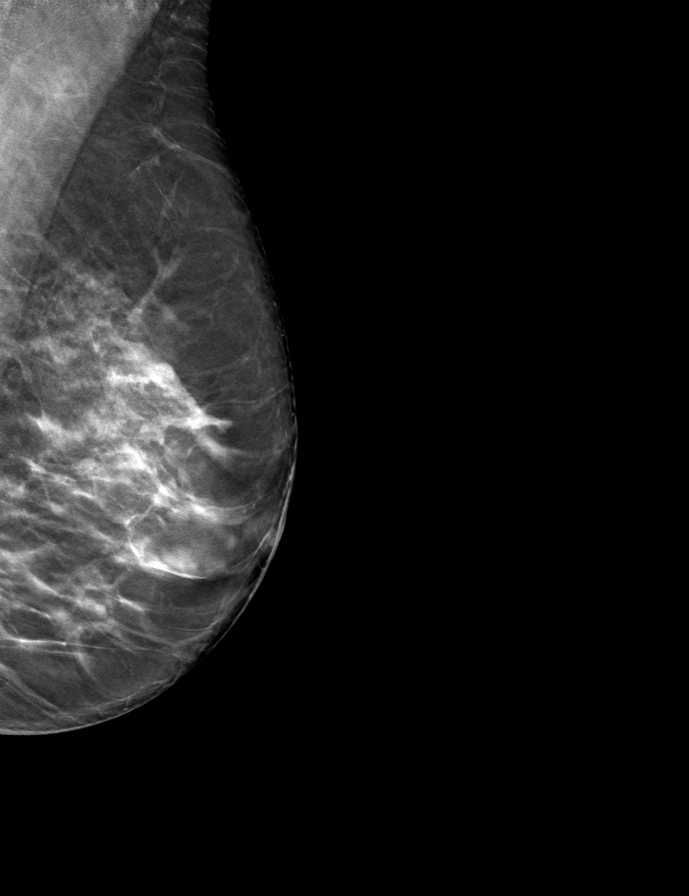

[9 of 24 positions shown; findings below may reference images not displayed]

ACR Breast Density Category c: The breast tissue is heterogeneously
dense, which may obscure small masses.
FINDINGS: In the right breast a possible mass requires further evaluation.

In the left breast a possible mass requires further evaluation.

Images were processed with CAD.
IMPRESSION: Further evaluation is suggested for possible a possible mass in the
right breast.

Further evaluation is suggested for possible a possible mass in the
left breast.

RECOMMENDATION:
Diagnostic mammogram and possibly ultrasound of both breasts.
(Code:8I-8-FFA)

The patient will be contacted regarding the findings, and additional
imaging will be scheduled.

BI-RADS CATEGORY  0: Incomplete. Need additional imaging evaluation
and/or prior mammograms for comparison.
